# Patient Record
Sex: Female | Born: 1960 | Race: White | Hispanic: No | Marital: Married | Smoking: Current every day smoker
Health system: Southern US, Community
[De-identification: ages and names within clinical notes are randomized; demographics above are authoritative.]

## PROBLEM LIST (undated history)

## (undated) DIAGNOSIS — G43909 Migraine, unspecified, not intractable, without status migrainosus: Secondary | ICD-10-CM

## (undated) DIAGNOSIS — M419 Scoliosis, unspecified: Secondary | ICD-10-CM

## (undated) DIAGNOSIS — M1990 Unspecified osteoarthritis, unspecified site: Secondary | ICD-10-CM

## (undated) DIAGNOSIS — M549 Dorsalgia, unspecified: Secondary | ICD-10-CM

## (undated) HISTORY — PX: TONSILLECTOMY: SUR1361

## (undated) HISTORY — PX: LAPAROSCOPIC ENDOMETRIOSIS FULGURATION: SUR769

## (undated) HISTORY — DX: Scoliosis, unspecified: M41.9

---

## 1997-09-03 ENCOUNTER — Other Ambulatory Visit: Admission: RE | Admit: 1997-09-03 | Discharge: 1997-09-03 | Payer: Self-pay | Admitting: Obstetrics and Gynecology

## 2000-08-11 ENCOUNTER — Other Ambulatory Visit: Admission: RE | Admit: 2000-08-11 | Discharge: 2000-08-11 | Payer: Self-pay | Admitting: Obstetrics and Gynecology

## 2010-01-27 ENCOUNTER — Emergency Department (HOSPITAL_BASED_OUTPATIENT_CLINIC_OR_DEPARTMENT_OTHER): Admission: EM | Admit: 2010-01-27 | Discharge: 2010-01-27 | Payer: Self-pay | Admitting: Emergency Medicine

## 2010-01-27 ENCOUNTER — Ambulatory Visit: Payer: Self-pay | Admitting: Diagnostic Radiology

## 2011-10-04 ENCOUNTER — Emergency Department (HOSPITAL_BASED_OUTPATIENT_CLINIC_OR_DEPARTMENT_OTHER)
Admission: EM | Admit: 2011-10-04 | Discharge: 2011-10-05 | Disposition: A | Discharge status: Home / Self Care | Payer: Self-pay | Attending: Emergency Medicine | Admitting: Emergency Medicine

## 2011-10-04 ENCOUNTER — Emergency Department (INDEPENDENT_AMBULATORY_CARE_PROVIDER_SITE_OTHER): Payer: Self-pay

## 2011-10-04 ENCOUNTER — Encounter (HOSPITAL_BASED_OUTPATIENT_CLINIC_OR_DEPARTMENT_OTHER): Payer: Self-pay | Admitting: Emergency Medicine

## 2011-10-04 DIAGNOSIS — W19XXXA Unspecified fall, initial encounter: Secondary | ICD-10-CM

## 2011-10-04 DIAGNOSIS — S62109A Fracture of unspecified carpal bone, unspecified wrist, initial encounter for closed fracture: Secondary | ICD-10-CM

## 2011-10-04 DIAGNOSIS — Z8739 Personal history of other diseases of the musculoskeletal system and connective tissue: Secondary | ICD-10-CM | POA: Insufficient documentation

## 2011-10-04 DIAGNOSIS — Z09 Encounter for follow-up examination after completed treatment for conditions other than malignant neoplasm: Secondary | ICD-10-CM

## 2011-10-04 DIAGNOSIS — S52509A Unspecified fracture of the lower end of unspecified radius, initial encounter for closed fracture: Secondary | ICD-10-CM

## 2011-10-04 DIAGNOSIS — S52609A Unspecified fracture of lower end of unspecified ulna, initial encounter for closed fracture: Secondary | ICD-10-CM

## 2011-10-04 HISTORY — DX: Migraine, unspecified, not intractable, without status migrainosus: G43.909

## 2011-10-04 HISTORY — DX: Unspecified osteoarthritis, unspecified site: M19.90

## 2011-10-04 HISTORY — DX: Dorsalgia, unspecified: M54.9

## 2011-10-04 MED ORDER — ONDANSETRON 8 MG PO TBDP: 8.0000 mg | ORAL_TABLET | Freq: Two times a day (BID) | ORAL | Status: AC | PRN

## 2011-10-04 MED ORDER — MIDAZOLAM HCL 5 MG/5ML IJ SOLN
INTRAMUSCULAR | Status: AC
  Administered 2011-10-04: 5 mg
  Filled 2011-10-04: qty 5

## 2011-10-04 MED ORDER — OXYCODONE-ACETAMINOPHEN 5-325 MG PO TABS: ORAL_TABLET | ORAL | Status: AC

## 2011-10-04 MED ORDER — HYDROMORPHONE HCL PF 1 MG/ML IJ SOLN
0.5000 mg | Freq: Once | INTRAMUSCULAR | Status: AC
  Administered 2011-10-04: 0.5 mg via INTRAVENOUS
  Filled 2011-10-04: qty 1

## 2011-10-04 MED ORDER — ONDANSETRON HCL 4 MG/2ML IJ SOLN
4.0000 mg | Freq: Once | INTRAMUSCULAR | Status: AC
  Administered 2011-10-04: 4 mg via INTRAVENOUS
  Filled 2011-10-04: qty 2

## 2011-10-04 MED ORDER — HYDROMORPHONE HCL PF 1 MG/ML IJ SOLN
1.0000 mg | Freq: Once | INTRAMUSCULAR | Status: AC
  Administered 2011-10-04: 1 mg via INTRAVENOUS
  Filled 2011-10-04: qty 1

## 2011-10-04 MED ORDER — PROPOFOL 10 MG/ML IV EMUL
INTRAVENOUS | Status: AC
  Filled 2011-10-04: qty 20

## 2011-10-04 MED ORDER — PROPOFOL 10 MG/ML IV BOLUS
60.0000 mg | Freq: Once | INTRAVENOUS | Status: AC
  Administered 2011-10-04: 60 mg via INTRAVENOUS
  Filled 2011-10-04: qty 6

## 2011-10-04 NOTE — ED Notes (Signed)
PT REFUSED SPLINT TO LT WRIST

## 2011-10-04 NOTE — ED Provider Notes (Signed)
History     CSN: [id number]  Arrival date & time 10/04/11  1909   First MD Initiated Contact with Patient 10/04/11 2031      Chief Complaint  Patient presents with  . Wrist Injury    (Consider location/radiation/quality/duration/timing/severity/associated sxs/prior treatment) HPI Comments: Level 5 caveat due to severe pain and urgent need for intervention.  Pt had a mechanical fall onto outstretched left arm with immediate pain to left non dominant wrist that radiates all the way to elbow.  Denies any other injury.  No LOC, no neck pain, no head injury.  No bleeding, laceration or abrasion noted at wrist.  Denies pain to hand.  Pt last ate at 6 PM.  She has had surgeries in the past, but only ill effect was emergency vomiting.  Pt reports nausea with analgesics as well.    Patient is a 51 y.o. female presenting with wrist injury. The history is provided by the patient and a relative.  Wrist Injury     Past Medical History  Diagnosis Date  . Endometriosis   . Arthritis   . Back pain   . Migraines     Past Surgical History  Procedure Date  . Laparoscopic endometriosis fulguration     No family history on file.  History  Substance Use Topics  . Smoking status: Current Everyday Smoker -- 1.0 packs/day    Types: Cigarettes  . Smokeless tobacco: Not on file  . Alcohol Use: No    OB History    Grav Para Term Preterm Abortions TAB SAB Ect Mult Living                  Review of Systems  Unable to perform ROS: Other    Allergies  Review of patient's allergies indicates no known allergies.  Home Medications   Current Outpatient Rx  Name Route Sig Dispense Refill  . CHLORZOXAZONE 500 MG PO TABS Oral Take 500 mg by mouth 3 (three) times daily as needed. For muscle spasms    . METOPROLOL TARTRATE 25 MG PO TABS Oral Take 25 mg by mouth daily. To prevent migraines    . NAPHAZOLINE-GLYCERIN 0.012-0.25 % OP SOLN Ophthalmic Apply 1 drop to eye every morning.    Marland Kitchen  ONDANSETRON 8 MG PO TBDP Oral Take 1 tablet (8 mg total) by mouth every 12 (twelve) hours as needed for nausea. 20 tablet 0  . OXYCODONE-ACETAMINOPHEN 5-325 MG PO TABS  1-2 tablets po q 6 hours prn moderate to severe pain 20 tablet 0    BP 111/80  Pulse 90  Temp(Src) 98.1 F (36.7 C) (Oral)  Resp 14  Ht 5\' 5"  (1.651 m)  Wt 135 lb (61.236 kg)  BMI 22.47 kg/m2  SpO2 98%  Physical Exam  Nursing note and vitals reviewed. Constitutional: She is oriented to person, place, and time. She appears well-developed and well-nourished. She appears distressed.  HENT:  Head: Normocephalic and atraumatic.  Neck: Normal range of motion. Neck supple. No spinous process tenderness present.  Cardiovascular: Normal rate.   Pulmonary/Chest: Effort normal. She has no wheezes. She has no rales.  Musculoskeletal:       Left wrist: She exhibits decreased range of motion, tenderness, bony tenderness, swelling and deformity. She exhibits no laceration.  Neurological: She is alert and oriented to person, place, and time.  Skin: Skin is warm and dry. No abrasion, no laceration and no rash noted. She is not diaphoretic.    ED Course  Reduction of  fracture Date/Time: 10/04/2011 10:24 PM Performed by: Lear Ng Authorized by: Lear Ng Consent: Verbal consent obtained. Written consent obtained. Risks and benefits: risks, benefits and alternatives were discussed Consent given by: patient and spouse Patient understanding: patient states understanding of the procedure being performed Patient identity confirmed: verbally with patient and arm band Time out: Immediately prior to procedure a "time out" was called to verify the correct patient, procedure, equipment, support staff and site/side marked as required. Local anesthesia used: no Patient sedated: yes Sedation type: moderate (conscious) sedation Sedatives: etomidate, midazolam and propofol Analgesia: hydromorphone Sedation start date/time:  10/04/2011 10:00 PM Sedation end date/time: 10/04/2011 10:20 PM Vitals: Vital signs were monitored during sedation. Patient tolerance: Patient tolerated the procedure well with no immediate complications. Comments: Distal radius and ulnar styloid fracture with angulation was manipulated to reduce.  Pt had good cap refill, intact RP before and after.  gross sensation and finger movement intact before and after.  Compartments soft.  Etomidate created severe muscle fasciculations and flexion as side effect, so propofol was used afterwards with better result.  Post procedure xray obtained after splint applied.    SPLINT APPLICATION Date/Time: 10/04/2011 10:26 PM Performed by: Lear Ng. Authorized by: Lear Ng Consent: Verbal consent obtained. Consent given by: patient Patient understanding: patient states understanding of the procedure being performed Patient identity confirmed: arm band Time out: Immediately prior to procedure a "time out" was called to verify the correct patient, procedure, equipment, support staff and site/side marked as required. Location details: left wrist Splint type: sugar tong Supplies used: cotton padding and Ortho-Glass Post-procedure: The splinted body part was neurovascularly unchanged following the procedure. Patient tolerance: Patient tolerated the procedure well with no immediate complications. Comments: Assisted by technician.   (including critical care time)  Labs Reviewed - No data to display Dg Wrist Complete Left  10/04/2011  *RADIOLOGY REPORT*  Clinical Data: Post reduction radiographs  LEFT WRIST - COMPLETE 3+ VIEW  Comparison: Left wrist radiographs - earlier same day  Findings:  Fine bony detail is limited due to overlying splint apparatus. Improved alignment of previously identified distal radial metaphyseal fracture.  Unchanged appearance of minimally displaced fracture of the ulnar styloid process. No new fractures identified.   IMPRESSION: 1.  Improved alignment of the distal left radial metaphyseal fracture. 2.  Unchanged appearance of minimally displaced ulnar styloid process fracture.  Original Report Authenticated By: Waynard Reeds, M.D.   Dg Wrist Complete Left  10/04/2011  *RADIOLOGY REPORT*  Clinical Data: Left wrist pain post fall  LEFT WRIST - COMPLETE 3+ VIEW  Comparison: None  Findings: Osseous demineralization. Transverse distal left radial metaphyseal fracture with dorsal displacement and apex volar angulation. Question intra-articular extension of fracture at both the radiocarpal and distal radioulnar joints. Ulnar styloid fracture. No additional fracture or dislocation seen.  IMPRESSION: Ulnar styloid fracture. Angulated and displaced transverse distal left radial metaphyseal fracture with suspected intra-articular extension at radiocarpal and distal radioulnar joints. Per CMS PQRS reporting requirements (PQRS Measure 24): Given the patient's age of greater than 50 and the fracture site (hip, distal radius, or spine), the patient should be tested for osteoporosis using DXA, and the appropriate treatment considered based on the DXA results.  Original Report Authenticated By: Lollie Marrow, M.D.     1. Wrist fracture, closed    I reviewed above plain films myself.     MDM  Pt with distal radial and ulnar styloid fracture per radiologist. I reviewed  film myself.  Angulated at about 45  Degrees in my estimation.  Cap refill is normal.  Gross sensation to fingers are intact.  Pt given analgesics and antemetic together due to side effects of N/V with strong meds.  Discussed with pt and family, will perform sedation and reduce wrist angle and pt can be referred to hand surgeon, Dr. Merlyn Lot.  I did speak to Dr. Merlyn Lot who can see pt splinted tomorrow.          Gavin Pound. Oletta Lamas, MD 10/04/11 [phone]

## 2011-10-04 NOTE — ED Notes (Signed)
Pt c/o LT wrist pain s/p fall while mowing grass

## 2011-10-04 NOTE — Discharge Instructions (Signed)
Wrist Fracture Your caregiver has diagnosed you as having a fracture of the wrist. A fracture is a break in the bone or bones. A cast or splint is used to protect and keep your injured bone(s) from moving. The cast or splint will usually be on for about 5 to 6 weeks. One of the bones of the wrist (the navicular bone) often does not show up as a fracture on X-ray until later or in the healing phase. With this bone your caregiver will often cast as though it is fractured even if not seen on the X-ray. HOME CARE INSTRUCTIONS   To lessen the swelling, keep the injured part elevated while sitting or lying down. Keeping the injury above the level of your heart (the center of the chest) will decrease swelling and pain.   Do not wear rings or jewelry on the injured hand or wrist.   Apply ice to the injury for 15 to 20 minutes, 3 to 4 times per day while awake for 2 days. Put the ice in a plastic bag and place a thin towel between the bag of ice and your cast.   If you have a plaster or fiberglass cast:   Do not try to scratch the skin under the cast using sharp or pointed objects.   Check the skin around the cast every day. You may put lotion on any red or sore areas.   Keep your cast dry and clean.   If you have a plaster splint:   Wear the splint as directed.   You may loosen the elastic around the splint if your fingers become numb, tingle, or turn cold or blue.   If you have been put in a removable splint, wear and use as directed.   Do not use powders or deodorants in or around the cast or splint.   Do not remove padding from your cast or splint.   Do not put pressure on any part of your cast or splint. It may break. Rest your cast or splint only on a pillow the first 24 hours until it is fully hardened.   Gently move your fingers often, so they do not get stiff.   Do not remove the splint unless directed by your caregiver. Casts must be removed by an orthopedist.   Your cast or  splint can be protected during bathing with a plastic bag. Do not lower the cast or splint into water.   Only take over-the-counter or prescription medicines for pain, discomfort, or fever as directed by your caregiver.   Follow up with your caregiver as directed.  SEEK IMMEDIATE MEDICAL CARE IF:   Your cast or splint gets damaged or breaks.   Your cast or splint feels too tight or loose.   You have increased pain, not controlled with medication.   You have increased swelling.   Your skin or nails below the injury turn blue or grey or feel cold or numb.   You have trouble moving or feeling your fingers.   You experience any burning or stinging from the cast or splint.   There is a bad smell coming from under the cast or splint.   New stains or fluids are coming from under the cast or splint.   You have any new injuries while wearing the cast or splint.  Document Released: 03/02/2005 Document Revised: 05/12/2011 Document Reviewed: 12/20/2006 [facility] Patient Information 2012 [place], [place].    Narcotic and benzodiazepine use may cause drowsiness, slowed breathing  or dependence.  Please use with caution and do not drive, operate machinery or watch young children alone while taking them.  Taking combinations of these medications or drinking alcohol will potentiate these effects.

## 2013-01-23 ENCOUNTER — Ambulatory Visit: Payer: Self-pay | Admitting: Podiatry

## 2017-09-07 ENCOUNTER — Other Ambulatory Visit: Payer: Self-pay | Admitting: Neurosurgery

## 2017-09-07 DIAGNOSIS — M4120 Other idiopathic scoliosis, site unspecified: Secondary | ICD-10-CM

## 2017-09-12 ENCOUNTER — Other Ambulatory Visit: Payer: Self-pay | Admitting: Neurosurgery

## 2017-09-12 DIAGNOSIS — M4120 Other idiopathic scoliosis, site unspecified: Secondary | ICD-10-CM

## 2017-10-06 ENCOUNTER — Other Ambulatory Visit: Payer: Self-pay | Admitting: Neurosurgery

## 2017-10-06 ENCOUNTER — Ambulatory Visit
Admission: RE | Admit: 2017-10-06 | Discharge: 2017-10-06 | Disposition: A | Discharge status: Home / Self Care | Payer: BLUE CROSS/BLUE SHIELD | Source: Ambulatory Visit | Attending: Neurosurgery | Admitting: Neurosurgery

## 2017-10-06 DIAGNOSIS — M4120 Other idiopathic scoliosis, site unspecified: Secondary | ICD-10-CM

## 2017-10-06 DIAGNOSIS — M8580 Other specified disorders of bone density and structure, unspecified site: Secondary | ICD-10-CM

## 2017-10-10 ENCOUNTER — Ambulatory Visit
Admission: RE | Admit: 2017-10-10 | Discharge: 2017-10-10 | Disposition: A | Discharge status: Home / Self Care | Payer: BLUE CROSS/BLUE SHIELD | Source: Ambulatory Visit | Attending: Neurosurgery | Admitting: Neurosurgery

## 2017-10-10 DIAGNOSIS — M4120 Other idiopathic scoliosis, site unspecified: Secondary | ICD-10-CM

## 2018-11-04 ENCOUNTER — Emergency Department (HOSPITAL_BASED_OUTPATIENT_CLINIC_OR_DEPARTMENT_OTHER)
Admission: EM | Admit: 2018-11-04 | Discharge: 2018-11-04 | Disposition: A | Discharge status: Home / Self Care | Payer: BLUE CROSS/BLUE SHIELD | Attending: Emergency Medicine | Admitting: Emergency Medicine

## 2018-11-04 ENCOUNTER — Emergency Department (HOSPITAL_BASED_OUTPATIENT_CLINIC_OR_DEPARTMENT_OTHER): Payer: BLUE CROSS/BLUE SHIELD

## 2018-11-04 ENCOUNTER — Other Ambulatory Visit: Payer: Self-pay

## 2018-11-04 ENCOUNTER — Encounter (HOSPITAL_BASED_OUTPATIENT_CLINIC_OR_DEPARTMENT_OTHER): Payer: Self-pay | Admitting: Emergency Medicine

## 2018-11-04 DIAGNOSIS — Y999 Unspecified external cause status: Secondary | ICD-10-CM | POA: Insufficient documentation

## 2018-11-04 DIAGNOSIS — F17210 Nicotine dependence, cigarettes, uncomplicated: Secondary | ICD-10-CM | POA: Insufficient documentation

## 2018-11-04 DIAGNOSIS — Y939 Activity, unspecified: Secondary | ICD-10-CM | POA: Diagnosis not present

## 2018-11-04 DIAGNOSIS — W010XXA Fall on same level from slipping, tripping and stumbling without subsequent striking against object, initial encounter: Secondary | ICD-10-CM | POA: Insufficient documentation

## 2018-11-04 DIAGNOSIS — S99922A Unspecified injury of left foot, initial encounter: Secondary | ICD-10-CM | POA: Diagnosis present

## 2018-11-04 DIAGNOSIS — Y92008 Other place in unspecified non-institutional (private) residence as the place of occurrence of the external cause: Secondary | ICD-10-CM | POA: Insufficient documentation

## 2018-11-04 DIAGNOSIS — Z79899 Other long term (current) drug therapy: Secondary | ICD-10-CM | POA: Diagnosis not present

## 2018-11-04 DIAGNOSIS — W19XXXA Unspecified fall, initial encounter: Secondary | ICD-10-CM

## 2018-11-04 DIAGNOSIS — S93402A Sprain of unspecified ligament of left ankle, initial encounter: Secondary | ICD-10-CM

## 2018-11-04 NOTE — ED Provider Notes (Signed)
[facility] EMERGENCY DEPARTMENT Provider Note   CSN: [id number] Arrival date & time: 11/04/18  1058    History   Chief Complaint Chief Complaint  Patient presents with  . Foot Pain    HPI Faith Page is a 58 y.o. female w PMHx arthritis, presenting to the ED with complaint of sudden onset of left foot pain after a mechanical fall yesterday.  Patient states she was outside and saw a snake.  She states she jumped back, however tripped and fell rolling her ankle.  She states initially was sore, however the pain is been worsening over the evening.  She has pain to the lateral aspect of her ankle and foot, as well as to her distal foot on the plantar aspect base of her toes, and her heel.  She states pain is worse with weightbearing.  She has a cam walker boot at home, and used it yesterday with some relief, however today she is having too much pain with weightbearing to tolerate it.  She treated symptoms with Goody's powder.  She did not hit her head or pass out.  She has some bruises to the right shin, however no other injuries reported.  No wounds.     The history is provided by the patient.    Past Medical History:  Diagnosis Date  . Arthritis   . Back pain   . Endometriosis   . Migraines     There are no active problems to display for this patient.   Past Surgical History:  Procedure Laterality Date  . LAPAROSCOPIC ENDOMETRIOSIS FULGURATION    . TONSILLECTOMY       OB History   No obstetric history on file.      Home Medications    Prior to Admission medications   Medication Sig Start Date End Date Taking? Authorizing Provider  gabapentin (NEURONTIN) 600 MG tablet Take 1 tablet tid 12/22/17  Yes [provider]  SUMAtriptan (IMITREX) 50 MG tablet  10/08/17  Yes [provider]  chlorzoxazone (PARAFON) 500 MG tablet Take 500 mg by mouth 3 (three) times daily as needed. For muscle spasms    [provider]  metoprolol tartrate  (LOPRESSOR) 25 MG tablet Take 25 mg by mouth daily. To prevent migraines    [provider]  Naphazoline-Glycerin (REDNESS RELIEF) 0.012-0.25 % SOLN Apply 1 drop to eye every morning.    [provider]    Family History No family history on file.  Social History Social History   Tobacco Use  . Smoking status: Current Every Day Smoker    Packs/day: 1.00    Types: Cigarettes  . Smokeless tobacco: Never Used  Substance Use Topics  . Alcohol use: No  . Drug use: No     Allergies   Codeine   Review of Systems Review of Systems  Musculoskeletal: Positive for arthralgias.  Skin: Negative for wound.     Physical Exam Updated Vital Signs BP (!) 151/95 (BP Location: Right Arm)   Pulse 74   Temp 98.7 F (37.1 C) (Oral)   Resp 18   Ht 5\' 2"  (1.575 m)   Wt 59 kg   SpO2 99%   BMI 23.78 kg/m   Physical Exam Vitals signs and nursing note reviewed.  Constitutional:      General: She is not in acute distress.    Appearance: She is well-developed.  HENT:     Head: Normocephalic and atraumatic.  Eyes:     Conjunctiva/sclera:  Conjunctivae normal.  Cardiovascular:     Rate and Rhythm: Normal rate.  Pulmonary:     Effort: Pulmonary effort is normal.  Musculoskeletal:     Comments: Left foot and ankle without obvious deformity.  There is tenderness to the lateral ankle just anterior to the lateral malleolus, as well as to the lateral foot.  There is tenderness to the plantar aspect of the foot at the base of the toes, as well as to the calcaneus.  Achilles is intact and nontender.  Pain with range of motion to the ankle.  Normal sensation and distal pulses.  Knee is nontender with normal range of motion.  Neurological:     Mental Status: She is alert.  Psychiatric:        Mood and Affect: Mood normal.        Behavior: Behavior normal.      ED Treatments / Results  Labs (all labs ordered are listed, but only abnormal results are displayed) Labs  Reviewed - No data to display  EKG None  Radiology Dg Ankle Complete Left  Result Date: 11/04/2018 CLINICAL DATA:  Twisting injury left foot and ankle today when the patient jumped when she saw a snake. Initial encounter. EXAM: LEFT ANKLE COMPLETE - 3+ VIEW COMPARISON:  None. FINDINGS: There is no evidence of fracture, dislocation, or joint effusion. There is no evidence of arthropathy or other focal bone abnormality. Soft tissues are unremarkable. IMPRESSION: Negative exam. Electronically Signed   By: Drusilla Kannerhomas  Dalessio M.D.   On: 11/04/2018 11:40   Dg Foot Complete Left  Result Date: 11/04/2018 CLINICAL DATA:  Twisting injury left foot and ankle today when the patient jumped when she saw a snake. Initial encounter. EXAM: LEFT FOOT - COMPLETE 3+ VIEW COMPARISON:  None. FINDINGS: There is no evidence of fracture or dislocation. There is no evidence of arthropathy or other focal bone abnormality. Soft tissues are unremarkable. IMPRESSION: Negative exam. Electronically Signed   By: Drusilla Kannerhomas  Dalessio M.D.   On: 11/04/2018 11:39    Procedures Procedures (including critical care time)  Medications Ordered in ED Medications - No data to display   Initial Impression / Assessment and Plan / ED Course  I have reviewed the triage vital signs and the nursing notes.  Pertinent labs & imaging results that were available during my care of the patient were reviewed by me and considered in my medical decision making (see chart for details).        Patient with left foot pain after mechanical fall yesterday.  Neurovascularly intact on exam.  No obvious deformities.  No significant swelling.  X-rays are negative for evidence of fracture.  At this time will treat as a sprain with ASO brace, crutches.  Sports medicine follow-up in 1 to 2 weeks.  NSAIDs, RICE therapy.  Patient agreeable to plan and safe for discharge.  Discussed results, findings, treatment and follow up. Patient advised of return  precautions. Patient verbalized understanding and agreed with plan.   Final Clinical Impressions(s) / ED Diagnoses   Final diagnoses:  Fall at home, initial encounter  Sprain of left ankle, unspecified ligament, initial encounter    ED Discharge Orders    None       Robinson, [place] N, PA-C 11/04/18 1159    Alvira MondaySchlossman, Erin, MD 11/04/18 1556

## 2018-11-04 NOTE — Discharge Instructions (Addendum)
Your x-rays appear normal today. Please read instructions below. Apply ice to your foot and ankle for 20 minutes at a time.  Elevate it as much as possible. You can take ibuprofen every 6 hours as needed for pain. Avoid weightbearing for the next week, then you can transition to the boot you have at home and weight-bear as tolerated.  If you still have significant pain with weightbearing, stay off of it until you have been reevaluated. Schedule an appointment with the sports medicine specialist in 2 weeks for follow-up on your injury. Return to the ER for new or concerning symptoms.

## 2018-11-04 NOTE — ED Triage Notes (Signed)
Saw a snake yesterday and jumped back and tripped , pain to left foot and ankle

## 2019-11-01 ENCOUNTER — Encounter (HOSPITAL_BASED_OUTPATIENT_CLINIC_OR_DEPARTMENT_OTHER): Payer: Self-pay | Admitting: Emergency Medicine

## 2019-11-01 ENCOUNTER — Emergency Department (HOSPITAL_BASED_OUTPATIENT_CLINIC_OR_DEPARTMENT_OTHER): Payer: BC Managed Care – PPO

## 2019-11-01 ENCOUNTER — Emergency Department (HOSPITAL_BASED_OUTPATIENT_CLINIC_OR_DEPARTMENT_OTHER)
Admission: EM | Admit: 2019-11-01 | Discharge: 2019-11-01 | Disposition: A | Discharge status: Home / Self Care | Payer: BC Managed Care – PPO | Attending: Emergency Medicine | Admitting: Emergency Medicine

## 2019-11-01 ENCOUNTER — Other Ambulatory Visit: Payer: Self-pay

## 2019-11-01 DIAGNOSIS — Y92008 Other place in unspecified non-institutional (private) residence as the place of occurrence of the external cause: Secondary | ICD-10-CM | POA: Diagnosis not present

## 2019-11-01 DIAGNOSIS — Z885 Allergy status to narcotic agent status: Secondary | ICD-10-CM | POA: Diagnosis not present

## 2019-11-01 DIAGNOSIS — S99912A Unspecified injury of left ankle, initial encounter: Secondary | ICD-10-CM | POA: Diagnosis present

## 2019-11-01 DIAGNOSIS — S8262XA Displaced fracture of lateral malleolus of left fibula, initial encounter for closed fracture: Secondary | ICD-10-CM

## 2019-11-01 DIAGNOSIS — F17210 Nicotine dependence, cigarettes, uncomplicated: Secondary | ICD-10-CM | POA: Diagnosis not present

## 2019-11-01 DIAGNOSIS — Y9301 Activity, walking, marching and hiking: Secondary | ICD-10-CM | POA: Diagnosis not present

## 2019-11-01 DIAGNOSIS — Y999 Unspecified external cause status: Secondary | ICD-10-CM | POA: Insufficient documentation

## 2019-11-01 DIAGNOSIS — W109XXA Fall (on) (from) unspecified stairs and steps, initial encounter: Secondary | ICD-10-CM | POA: Insufficient documentation

## 2019-11-01 MED ORDER — HYDROCODONE-ACETAMINOPHEN 5-325 MG PO TABS: ORAL_TABLET | ORAL | 0 refills | Status: AC

## 2019-11-01 MED ORDER — NAPROXEN 500 MG PO TABS
500.0000 mg | ORAL_TABLET | Freq: Two times a day (BID) | ORAL | 0 refills | Status: AC
Start: 2019-11-01 — End: ?

## 2019-11-01 MED ORDER — HYDROCODONE-ACETAMINOPHEN 5-325 MG PO TABS
1.0000 | ORAL_TABLET | Freq: Once | ORAL | Status: AC
  Administered 2019-11-01: 1 via ORAL
  Filled 2019-11-01: qty 1

## 2019-11-01 MED FILL — NAPROXEN 500 MG TABS: 500 | 10 days supply | Qty: 20 | Fill #0

## 2019-11-01 MED FILL — HYDROCODON-APAP 5-325: 5-325 | 3 days supply | Qty: 10 | Fill #0

## 2019-11-01 NOTE — ED Triage Notes (Signed)
Left ankle pain, injury last night.  Noted significant swelling and bruising.  Unable to tolerate weight bearing

## 2019-11-01 NOTE — ED Notes (Signed)
Left ankle pain and swollen  Injured yesterday am  Missed bottom step trying to keep from stepping on dog

## 2019-11-01 NOTE — ED Provider Notes (Signed)
[facility] EMERGENCY DEPARTMENT Provider Note   CSN: [id number] Arrival date & time: 11/01/19  5397     History Chief Complaint  Patient presents with  . Ankle Pain    Faith Page is a 59 y.o. female.  Patient with history of osteoporosis, arthritis, scoliosis -- presents to the emergency department with acute onset of left ankle pain and swelling after injury last night.  She states she was walking down her stairs at home and got tripped up on her dog who was running down the stairs with her.  She missed the last step and landed awkwardly twisting her left ankle.  She denies falling or hitting her head.  Patient had difficulty sleeping and was unable to ambulate.  She applied ice at home and elevated the ankle without improvement.  She denies knee or hip pain.  No other injuries reported.        Past Medical History:  Diagnosis Date  . Arthritis   . Back pain   . Endometriosis   . Migraines     There are no problems to display for this patient.   Past Surgical History:  Procedure Laterality Date  . LAPAROSCOPIC ENDOMETRIOSIS FULGURATION    . TONSILLECTOMY       OB History   No obstetric history on file.     No family history on file.  Social History   Tobacco Use  . Smoking status: Current Every Day Smoker    Packs/day: 1.00    Types: Cigarettes  . Smokeless tobacco: Never Used  Substance Use Topics  . Alcohol use: No  . Drug use: No    Home Medications Prior to Admission medications   Medication Sig Start Date End Date Taking? Authorizing Provider  chlorzoxazone (PARAFON) 500 MG tablet Take 500 mg by mouth 3 (three) times daily as needed. For muscle spasms    [provider]  gabapentin (NEURONTIN) 600 MG tablet Take 1 tablet tid 12/22/17   [provider]  metoprolol tartrate (LOPRESSOR) 25 MG tablet Take 25 mg by mouth daily. To prevent migraines    [provider]  Naphazoline-Glycerin (REDNESS RELIEF)  0.012-0.25 % SOLN Apply 1 drop to eye every morning.    [provider]  SUMAtriptan (IMITREX) 50 MG tablet  10/08/17   [provider]    Allergies    Codeine  Review of Systems   Review of Systems  Constitutional: Negative for fever.  Musculoskeletal: Positive for arthralgias, gait problem and joint swelling. Negative for back pain and neck pain.  Skin: Negative for wound.  Neurological: Negative for weakness and numbness.    Physical Exam Updated Vital Signs BP 104/83   Pulse 88   Temp 98.3 F (36.8 C)   Resp 16   Ht 5\' 2"  (1.575 m)   Wt 61.2 kg   SpO2 97%   BMI 24.69 kg/m   Physical Exam Vitals and nursing note reviewed.  Constitutional:      Appearance: She is well-developed.  HENT:     Head: Normocephalic and atraumatic.  Eyes:     Pupils: Pupils are equal, round, and reactive to light.  Cardiovascular:     Pulses: Normal pulses. No decreased pulses.          Dorsalis pedis pulses are 2+ on the right side and 2+ on the left side.  Musculoskeletal:        General: Tenderness present.     Cervical back: Normal range of motion  and neck supple.     Right hip: No tenderness.     Left hip: No tenderness.     Right knee: Normal range of motion. No tenderness.     Left knee: Normal range of motion. No tenderness.     Right ankle: No swelling. Normal range of motion.     Left ankle: Swelling present. Tenderness present over the lateral malleolus. No base of 5th metatarsal or proximal fibula tenderness. Decreased range of motion.     Right foot: Normal range of motion. No swelling or tenderness.     Left foot: Normal range of motion. Swelling present. No tenderness.  Skin:    General: Skin is warm and dry.  Neurological:     Mental Status: She is alert.     Sensory: No sensory deficit.     Comments: Motor, sensation, and vascular distal to the injury is fully intact.      ED Results / Procedures / Treatments   Labs (all labs ordered are listed,  but only abnormal results are displayed) Labs Reviewed - No data to display  EKG None  Radiology No results found.  Procedures Procedures (including critical care time)  Medications Ordered in ED Medications - No data to display  ED Course  I have reviewed the triage vital signs and the nursing notes.  Pertinent labs & imaging results that were available during my care of the patient were reviewed by me and considered in my medical decision making (see chart for details).  Patient seen and examined.  Portable x-rays reviewed by myself.  Patient with lateral malleolus fracture.  Awaiting radiology read.  Will order oral pain medication.  Patient will likely need a cam walker.  She states that she has new crutches at home.  She will need an orthopedic referral.  Her lower extremity is neurovascularly intact at time of exam without signs of compartment syndrome.  Vital signs reviewed and are as follows: BP 104/83   Pulse 88   Temp 98.3 F (36.8 C)   Resp 16   Ht 5\' 2"  (1.575 m)   Wt 61.2 kg   SpO2 97%   BMI 24.69 kg/m   Patient counseled on use of narcotic pain medications. Counseled not to combine these medications with others containing tylenol. Urged not to drink alcohol, drive, or perform any other activities that requires focus while taking these medications. The patient verbalizes understanding and agrees with the plan.  10:33 AM Pt updated on x-ray results.  CAM Walker placed.  Discussed risk protocol, NSAIDs.  Prescription sent to [facility].  Patient ready for discharge.     MDM Rules/Calculators/A&P                      Pt with closed lateral malleolus fx. patient provided with pain control with Vicodin and naproxen, cam walker.  She has crutches at home.  She will need orthopedic follow-up.  Counseled on rice protocol, NSAIDs, need for orthopedic follow-up.  Counseled on limited weightbearing until orthopedic follow-up.   Final Clinical  Impression(s) / ED Diagnoses Final diagnoses:  Closed fracture of proximal lateral malleolus of left fibula, initial encounter    Rx / DC Orders ED Discharge Orders         Ordered    HYDROcodone-acetaminophen (NORCO/VICODIN) 5-325 MG tablet     11/01/19 1032    naproxen (NAPROSYN) 500 MG tablet  2 times daily     11/01/19 1032  Renne Crigler, PA-C 11/01/19 1033    Tilden Fossa, MD 11/02/19 [phone]

## 2019-11-01 NOTE — Discharge Instructions (Signed)
Please read and follow all provided instructions.  Your diagnoses today include:  1. Closed fracture of proximal lateral malleolus of left fibula, initial encounter     Tests performed today include:  An x-ray of the affected area - shows a fracture (break) of the outside of the ankle bones  Vital signs. See below for your results today.   Medications prescribed:   Vicodin (hydrocodone/acetaminophen) - narcotic pain medication  DO NOT drive or perform any activities that require you to be awake and alert because this medicine can make you drowsy. BE VERY CAREFUL not to take multiple medicines containing Tylenol (also called acetaminophen). Doing so can lead to an overdose which can damage your liver and cause liver failure and possibly death.   Naproxen - anti-inflammatory pain medication  Do not exceed 500mg  naproxen every 12 hours, take with food  You have been prescribed an anti-inflammatory medication or NSAID. Take with food. Take smallest effective dose for the shortest duration needed for your pain. Stop taking if you experience stomach pain or vomiting.   Take any prescribed medications only as directed.  Home care instructions:   Follow any educational materials contained in this packet  Use your crutches at home at all times and well as the walking boot if you are up and about  Follow R.I.C.E. Protocol:  R - rest your injury   I  - use ice on injury without applying directly to skin  C - compress injury with bandage or splint  E - elevate the injury as much as possible  Follow-up instructions: Please follow-up with the provided orthopedic physician (bone specialist) in 1 week.   Return instructions:   Please return if your toes or feet are numb or tingling, appear gray or blue, or you have severe pain (also elevate the leg and loosen splint or wrap if you were given one)  Please return to the Emergency Department if you experience worsening symptoms.    Please return if you have any other emergent concerns.  Additional Information:  Your vital signs today were: BP 104/83   Pulse 88   Temp 98.3 F (36.8 C)   Resp 16   Ht 5\' 2"  (1.575 m)   Wt 61.2 kg   SpO2 97%   BMI 24.69 kg/m  If your blood pressure (BP) was elevated above 135/85 this visit, please have this repeated by your doctor within one month.

## 2019-11-01 NOTE — ED Notes (Signed)
Cam boot applied prior to d/c  

## 2020-07-06 IMAGING — DX LEFT ANKLE COMPLETE - 3+ VIEW
3 series · 3 of 3 positions shown · non-contrast
Comparison: None.

CLINICAL DATA: Twisting injury left foot and ankle today when the
patient jumped when she saw a snake. Initial encounter.

EXAM:
LEFT ANKLE COMPLETE - 3+ VIEW

[ankle ap]
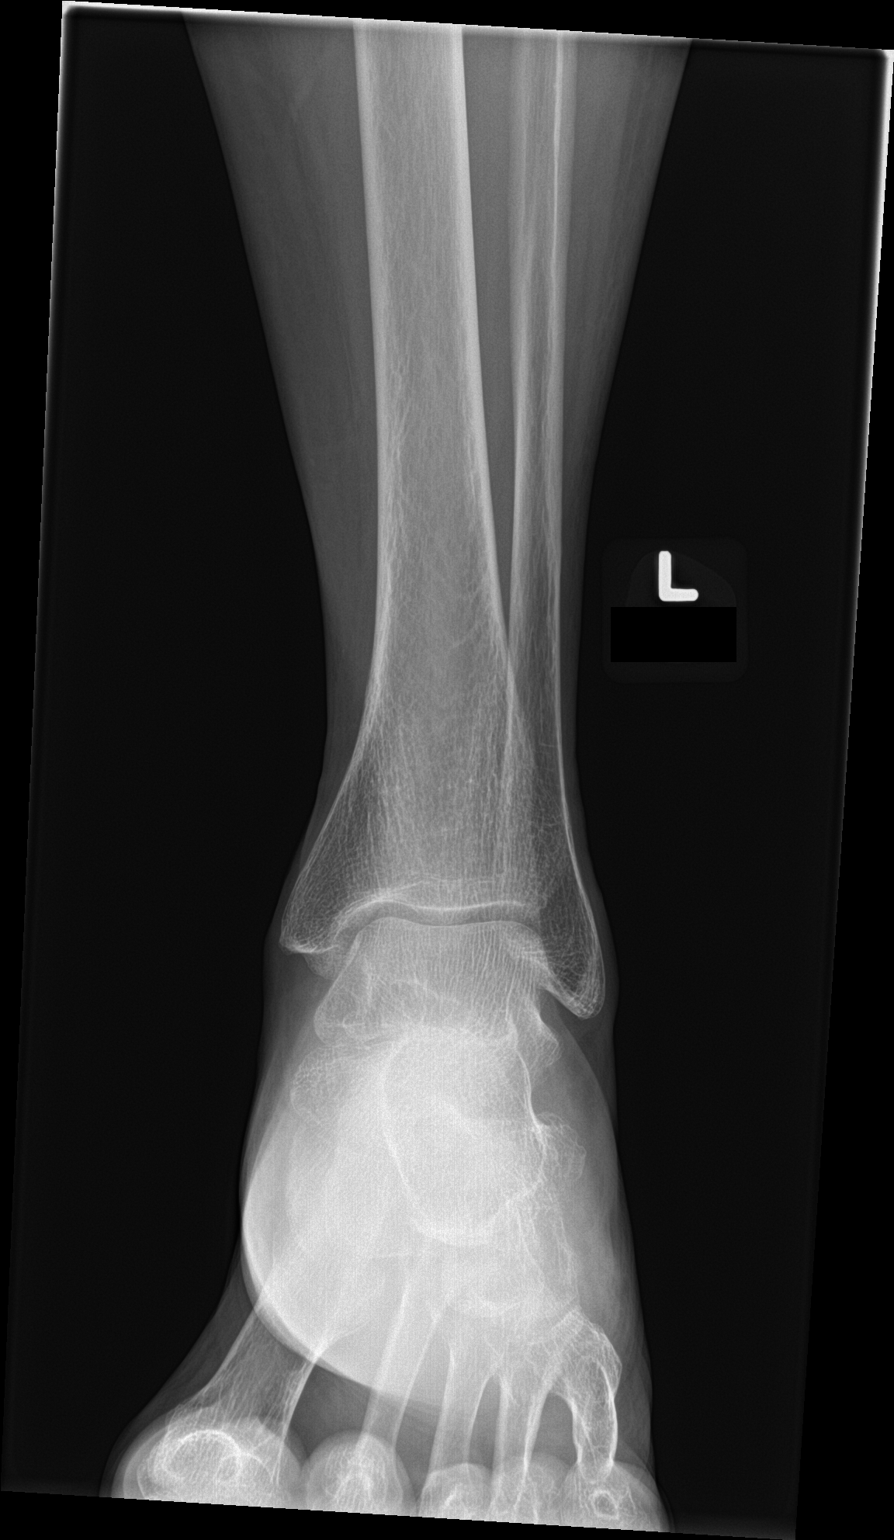

[ankle obl]
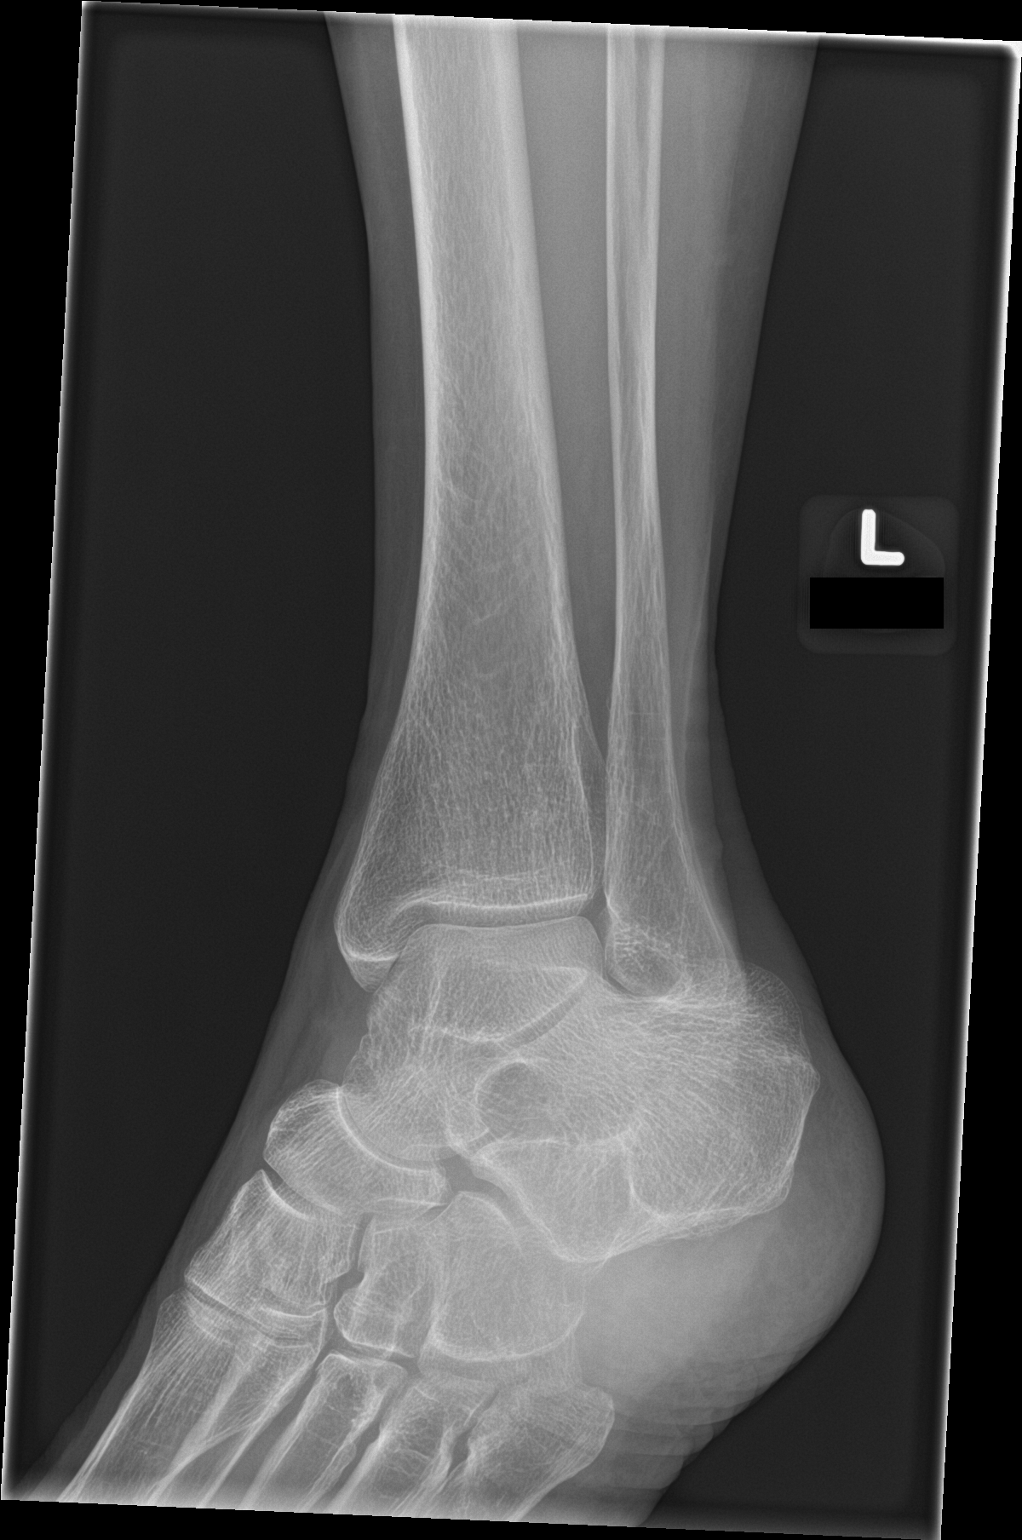

[ankle lat]
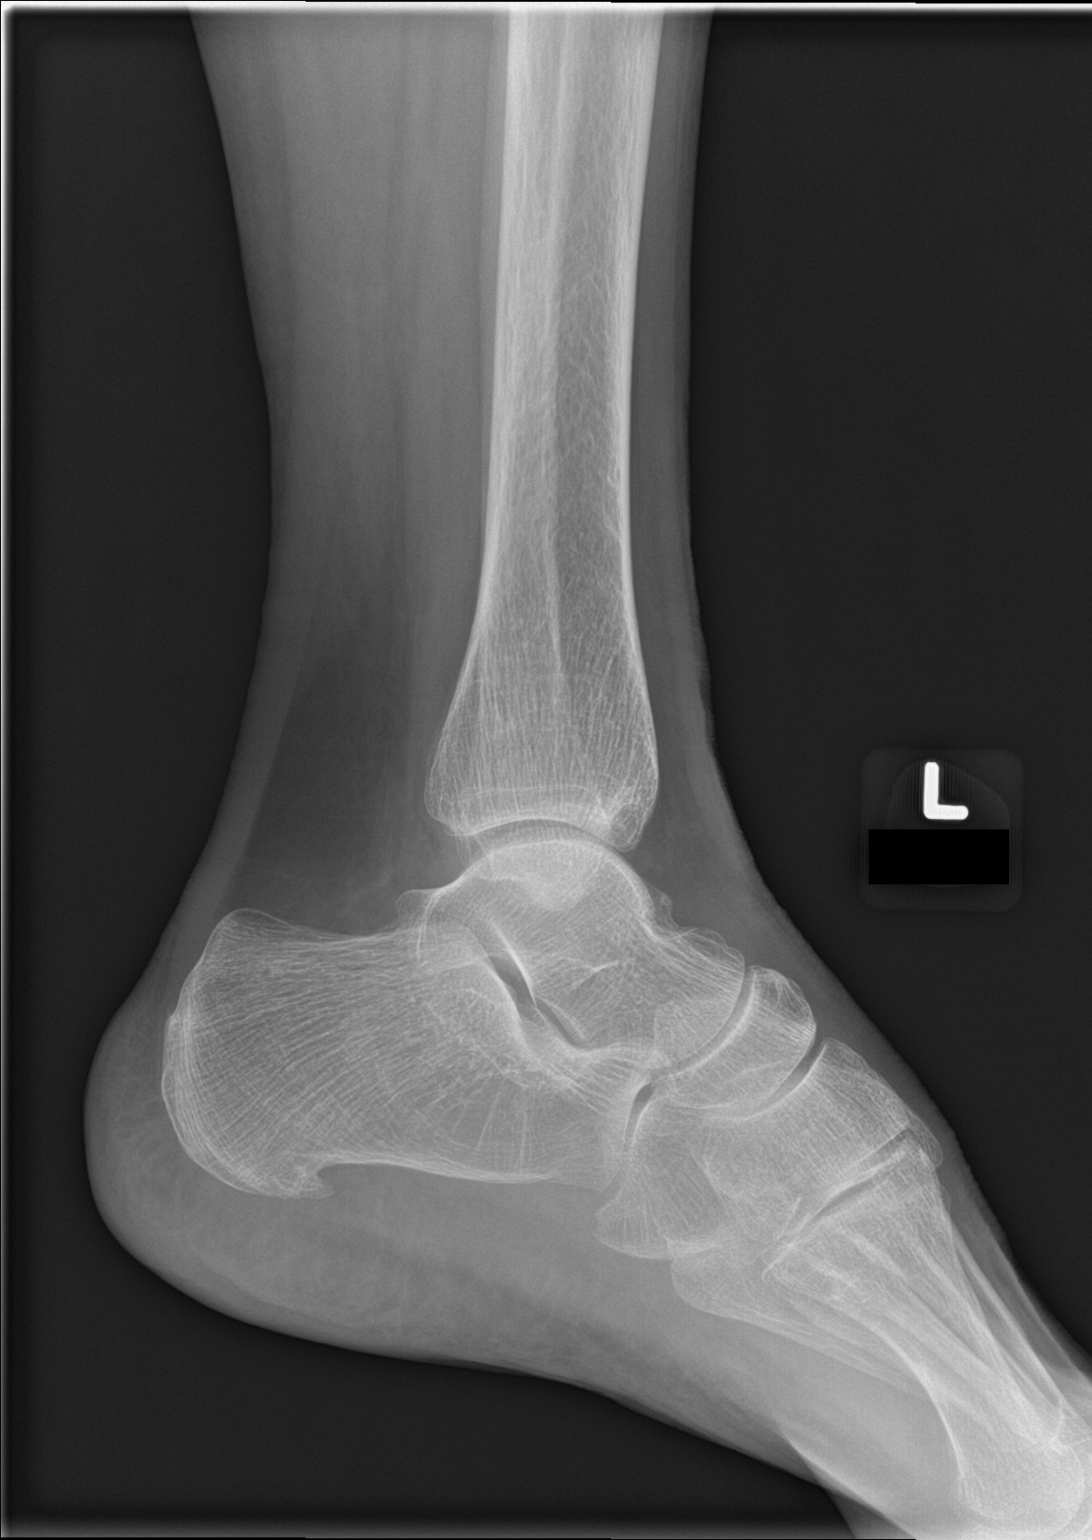

[3 of 3 positions shown; findings below may reference images not displayed]

FINDINGS: There is no evidence of fracture, dislocation, or joint effusion.
There is no evidence of arthropathy or other focal bone abnormality.
Soft tissues are unremarkable.
IMPRESSION: Negative exam.

## 2021-07-03 IMAGING — DX DG ANKLE COMPLETE 3+V*L*
3 series · 3 of 3 positions shown · non-contrast
Comparison: None.

CLINICAL DATA: Left ankle pain after injury last night.

EXAM:
LEFT ANKLE COMPLETE - 3+ VIEW

[ankle ap]
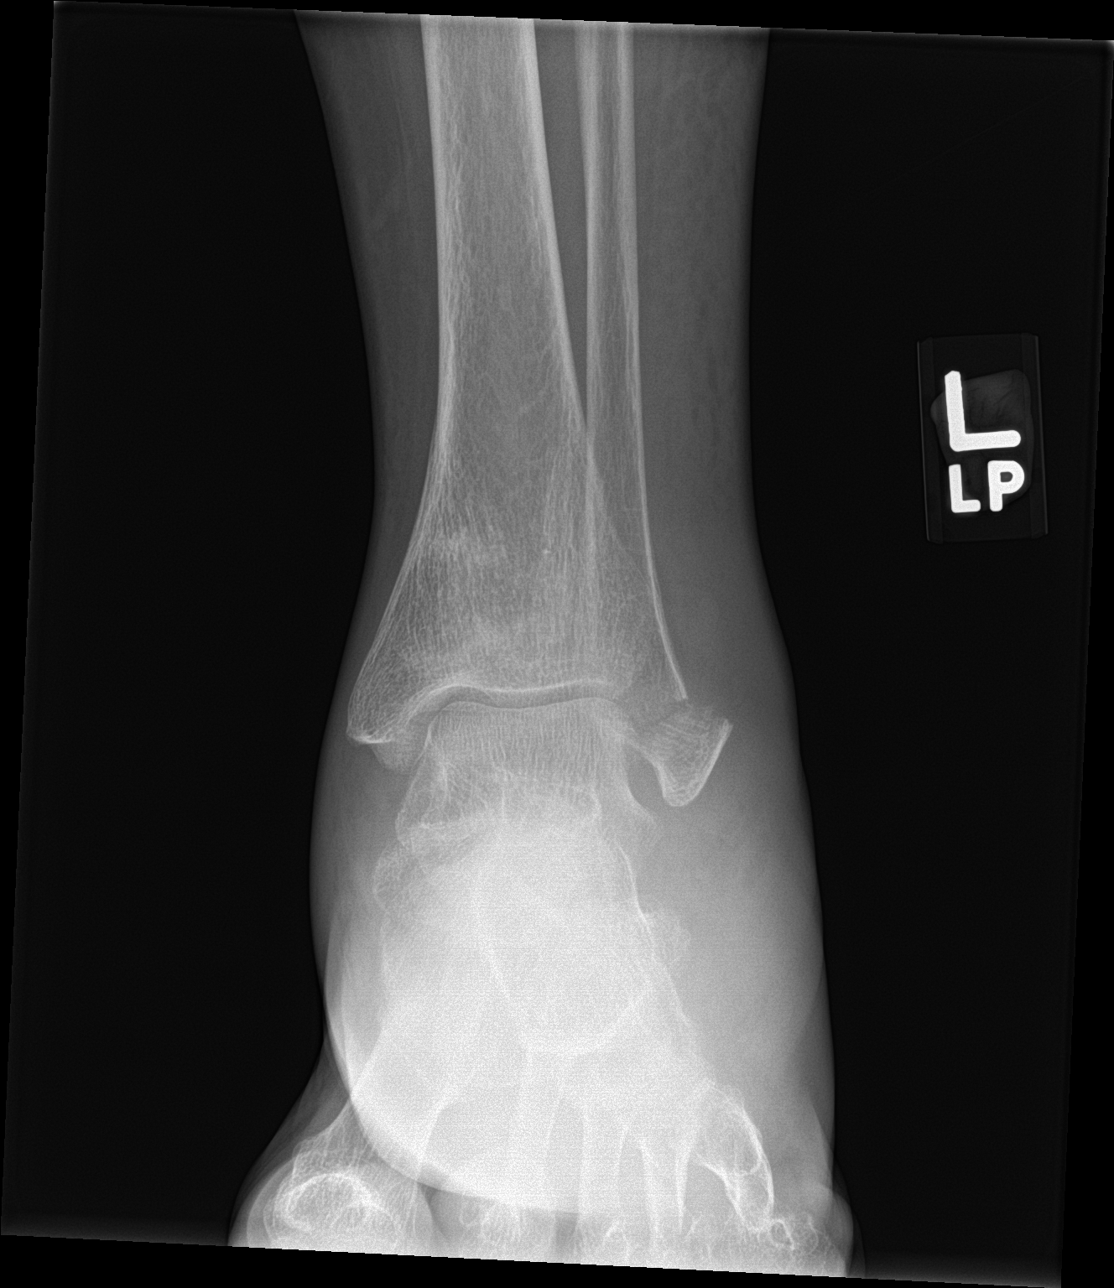

[ankle obl]
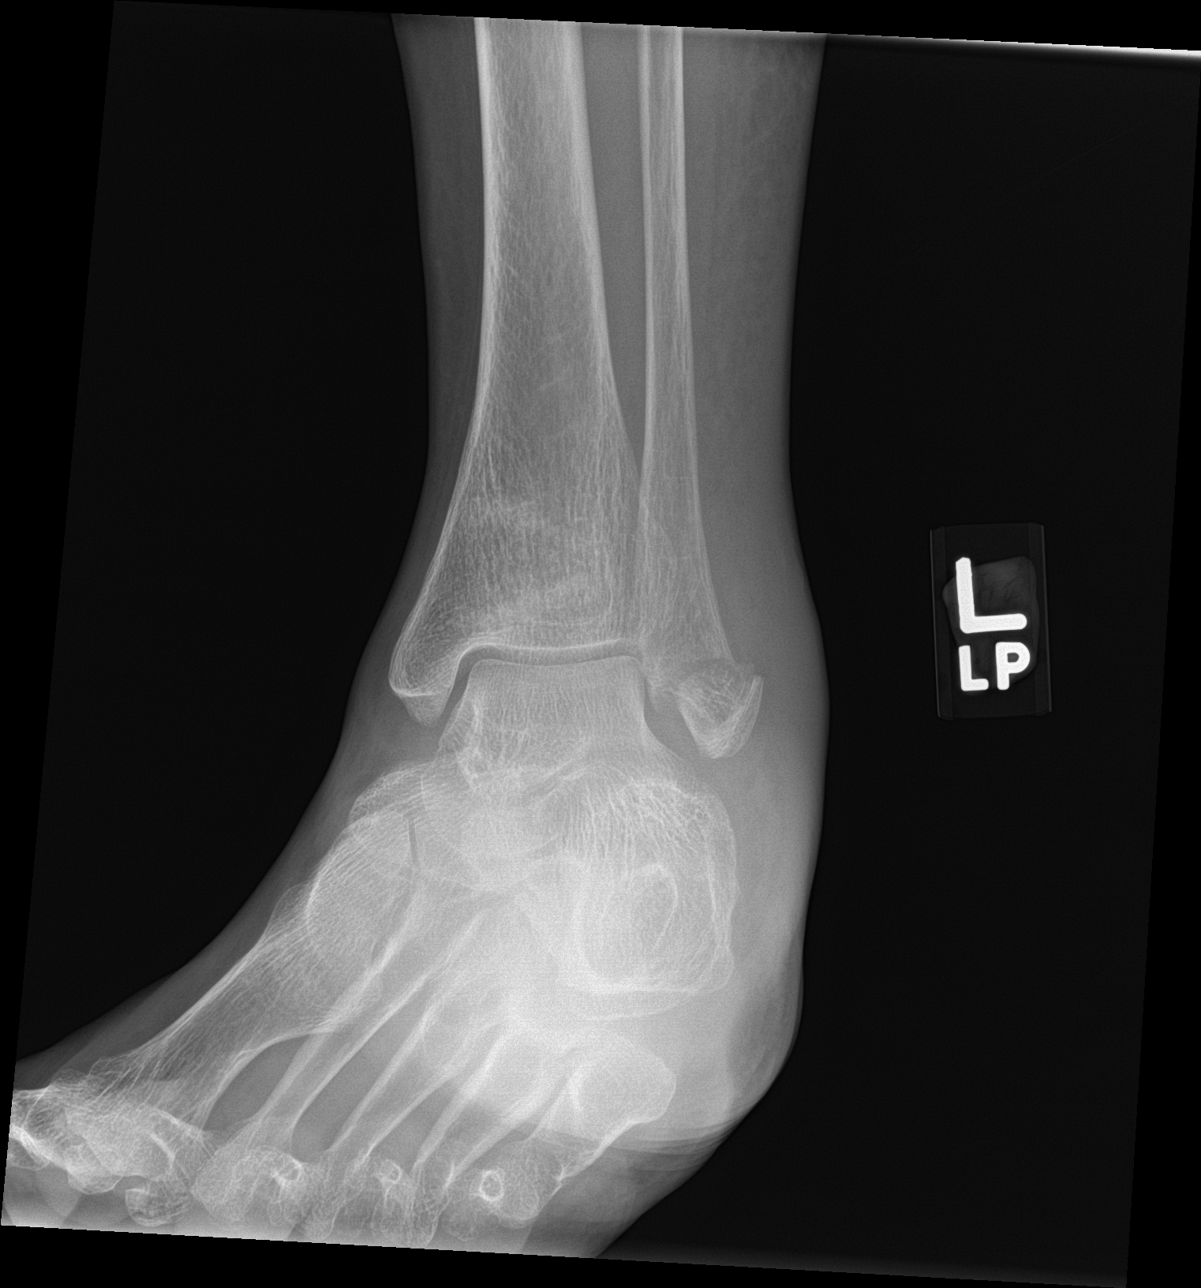

[ankle lat]
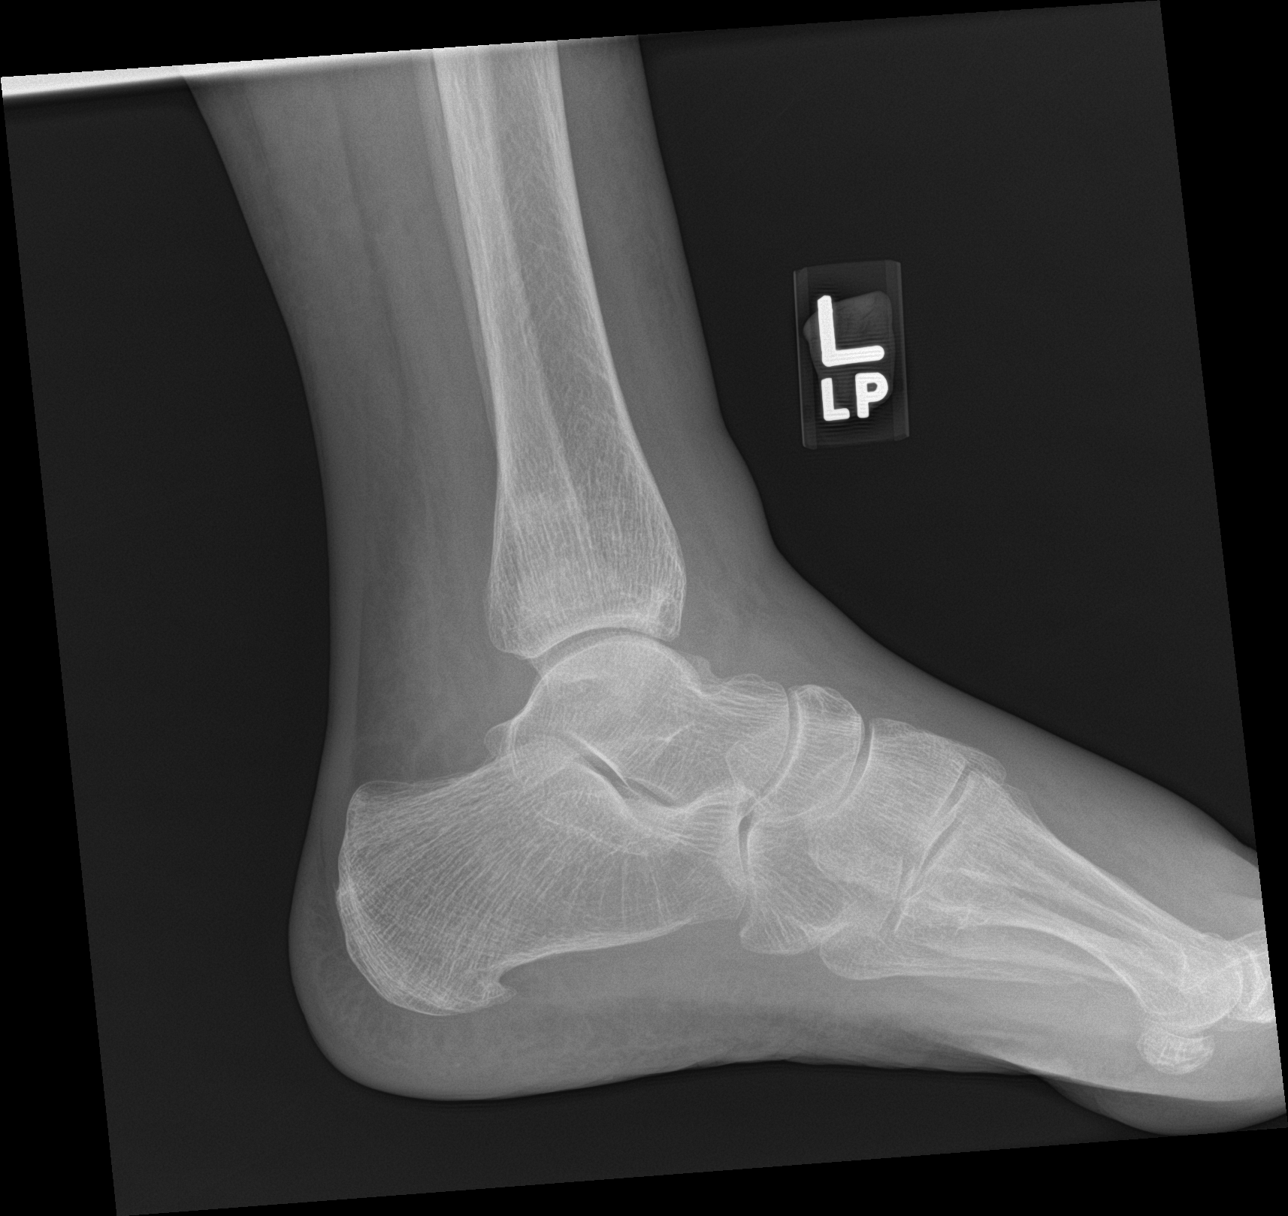

[3 of 3 positions shown; findings below may reference images not displayed]

FINDINGS: Transverse fracture of the distal fibula noted, below the
syndesmosis. No evidence for distal tibia fracture. No subluxation
or dislocation at the ankle joint.
IMPRESSION: Distal fibula fracture, below the syndesmosis.

## 2022-12-09 ENCOUNTER — Emergency Department (HOSPITAL_BASED_OUTPATIENT_CLINIC_OR_DEPARTMENT_OTHER)
Admission: EM | Admit: 2022-12-09 | Discharge: 2022-12-09 | Disposition: A | Discharge status: Home / Self Care | Payer: BLUE CROSS/BLUE SHIELD | Attending: Emergency Medicine | Admitting: Emergency Medicine

## 2022-12-09 ENCOUNTER — Encounter (HOSPITAL_BASED_OUTPATIENT_CLINIC_OR_DEPARTMENT_OTHER): Payer: Self-pay | Admitting: Emergency Medicine

## 2022-12-09 ENCOUNTER — Other Ambulatory Visit: Payer: Self-pay

## 2022-12-09 DIAGNOSIS — I10 Essential (primary) hypertension: Secondary | ICD-10-CM | POA: Insufficient documentation

## 2022-12-09 DIAGNOSIS — G5630 Lesion of radial nerve, unspecified upper limb: Secondary | ICD-10-CM | POA: Diagnosis not present

## 2022-12-09 DIAGNOSIS — G5632 Lesion of radial nerve, left upper limb: Secondary | ICD-10-CM

## 2022-12-09 DIAGNOSIS — Z79899 Other long term (current) drug therapy: Secondary | ICD-10-CM | POA: Diagnosis not present

## 2022-12-09 DIAGNOSIS — F17200 Nicotine dependence, unspecified, uncomplicated: Secondary | ICD-10-CM | POA: Insufficient documentation

## 2022-12-09 DIAGNOSIS — M21332 Wrist drop, left wrist: Secondary | ICD-10-CM | POA: Insufficient documentation

## 2022-12-09 LAB — CBG MONITORING, ED: Glucose-Capillary: 114 mg/dL — ABNORMAL HIGH (ref 70–99)

## 2022-12-09 NOTE — ED Provider Notes (Signed)
[place] EMERGENCY DEPARTMENT AT [facility] Provider Note   CSN: [id number] Arrival date & time: 12/09/22  1430     History  Chief Complaint  Patient presents with   Weakness    Faith Page is a 62 y.o. female with a past medical history of Chronic pain due to scoliosis, hypertension and smoking who presents emergency department with chief complaint of left-sided wrist drop.  She is here with her daughter.  Daughter states that she woke up yesterday around 11:30 AM after sleeping in a recliner.  Daughter states that she was sleeping on top of her left arm.  When she woke up she noticed that her left hand was weak.  She was dropping things out of her left hand all day.  Her daughter states that last night she had some slurred speech and states that this is not an infrequent thing for her and tends to get worse when she is sleepy.  She has otherwise no other neurologic complaints including changes in vision difficulty swallowing ataxia vertigo vision change facial weakness.  Weakness      Home Medications Prior to Admission medications   Medication Sig Start Date End Date Taking? Authorizing Provider  chlorzoxazone (PARAFON) 500 MG tablet Take 500 mg by mouth 3 (three) times daily as needed. For muscle spasms    [provider]  gabapentin (NEURONTIN) 600 MG tablet Take 1 tablet tid 12/22/17   [provider]  HYDROcodone-acetaminophen (NORCO/VICODIN) 5-325 MG tablet Take 1 tablet every 6 hours as needed for severe pain. 11/01/19   Renne Crigler, PA-C  metoprolol tartrate (LOPRESSOR) 25 MG tablet Take 25 mg by mouth daily. To prevent migraines    [provider]  Naphazoline-Glycerin (REDNESS RELIEF) 0.012-0.25 % SOLN Apply 1 drop to eye every morning.    [provider]  naproxen (NAPROSYN) 500 MG tablet Take 1 tablet (500 mg total) by mouth 2 (two) times daily. 11/01/19   Renne Crigler, PA-C  SUMAtriptan (IMITREX) 50 MG tablet   10/08/17   [provider]      Allergies    Antihistamines, chlorpheniramine-type; Codeine; and Diclofenac    Review of Systems   Review of Systems  Neurological:  Positive for weakness.    Physical Exam Updated Vital Signs BP 129/69 (BP Location: Right Arm)   Pulse 86   Temp 98.3 F (36.8 C) (Oral)   Resp 16   Ht 5\' 2"  (1.575 m)   Wt 49.3 kg   SpO2 94%   BMI 19.86 kg/m  Physical Exam Vitals and nursing note reviewed.  Constitutional:      General: She is not in acute distress.    Appearance: She is well-developed. She is not diaphoretic.  HENT:     Head: Normocephalic and atraumatic.     Right Ear: External ear normal.     Left Ear: External ear normal.     Nose: Nose normal.     Mouth/Throat:     Mouth: Mucous membranes are moist.  Eyes:     General: No visual field deficit or scleral icterus.    Conjunctiva/sclera: Conjunctivae normal.  Cardiovascular:     Rate and Rhythm: Normal rate and regular rhythm.     Heart sounds: Normal heart sounds. No murmur heard.    No friction rub. No gallop.  Pulmonary:     Effort: Pulmonary effort is normal. No respiratory distress.     Breath sounds: Normal breath sounds.  Abdominal:  General: Bowel sounds are normal. There is no distension.     Palpations: Abdomen is soft. There is no mass.     Tenderness: There is no abdominal tenderness. There is no guarding.  Musculoskeletal:     Cervical back: Normal range of motion.  Skin:    General: Skin is warm and dry.  Neurological:     Mental Status: She is alert and oriented to person, place, and time.     Cranial Nerves: Cranial nerves 2-12 are intact. No cranial nerve deficit, dysarthria or facial asymmetry.     Sensory: No sensory deficit.     Motor: Weakness present.     Coordination: Coordination is intact.     Gait: Gait is intact.     Deep Tendon Reflexes: Reflexes normal.     Comments: Is in the left wrist with extension only Speech is fluent and goal  oriented  Psychiatric:        Behavior: Behavior normal.     ED Results / Procedures / Treatments   Labs (all labs ordered are listed, but only abnormal results are displayed) Labs Reviewed  CBG MONITORING, ED - Abnormal; Notable for the following components:      Result Value   Glucose-Capillary 114 (*)    All other components within normal limits    EKG None  Radiology No results found.  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  Differential diagnosis includes myelopathy, stroke.  Patient has no evidence of other acute neurologic deficit. Will place her in a wrist splint and have her follow closely with Ortho hand.  Discussed outpatient follow-up and return precautions.        Final Clinical Impression(s) / ED Diagnoses Final diagnoses:  None    Rx / DC Orders ED Discharge Orders     None         Arthor Captain, PA-C 12/09/22 1539    Vanetta Mulders, MD 12/09/22 [phone]

## 2022-12-09 NOTE — ED Notes (Signed)
ED Provider at bedside. 

## 2022-12-09 NOTE — Discharge Instructions (Signed)
Follow up with Dr. Merlyn Lot who is an [facility] provider Follow the care instructions on the attached handout. Contact a health care provider if: You have a sudden increase in pain. You have new numbness or new loss of sensation in your hand. You have a sudden change in your ability to move your arm, wrist, or hand. Get help right away if: Your fingers turn blue or cold.

## 2022-12-09 NOTE — ED Triage Notes (Signed)
Pt has left hand weakness with hand drop since yesterday 1 pm.  No facial droop, no other weakness noted.  No numbness.  No slurring of speech noted.  Family relates the patient is really slow about twice a month and thought the patient was slurring her words last night.
